# Patient Record
Sex: Female | Born: 2000 | Race: White | Hispanic: No | Marital: Single | Smoking: Never smoker
Health system: Southern US, Community
[De-identification: ages and names within clinical notes are randomized; demographics above are authoritative.]

---
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
## 2019-05-05 ENCOUNTER — Other Ambulatory Visit: Payer: Self-pay

## 2019-05-05 ENCOUNTER — Ambulatory Visit: Payer: Self-pay | Attending: Internal Medicine

## 2019-05-05 DIAGNOSIS — Z23 Encounter for immunization: Secondary | ICD-10-CM

## 2019-05-05 NOTE — Progress Notes (Signed)
   Covid-19 Vaccination Clinic  Name:  Morgan Newman    MRN: [id number] DOB: 09-Dec-2000  05/05/2019  [name] was observed post Covid-19 immunization for 15 minutes without incident. She was provided with Vaccine Information Sheet and instruction to access the V-Safe system.   [name] was instructed to call 911 with any severe reactions post vaccine: Marland Kitchen Difficulty breathing  . Swelling of face and throat  . A fast heartbeat  . A bad rash all over body  . Dizziness and weakness   Immunizations Administered    Name Date Dose VIS Date Route   Pfizer COVID-19 Vaccine 05/05/2019 10:11 AM 0.3 mL 01/27/2019 Intramuscular   Manufacturer: [employer], [employer]   Lot: [id number]   NDC: [address]-7

## 2019-05-31 ENCOUNTER — Ambulatory Visit: Payer: Self-pay | Attending: Internal Medicine

## 2019-05-31 DIAGNOSIS — Z23 Encounter for immunization: Secondary | ICD-10-CM

## 2019-05-31 NOTE — Progress Notes (Signed)
   Covid-19 Vaccination Clinic  Name:  Marium Ragan    MRN: [id number] DOB: 12/03/2000  05/31/2019  Ms. Creeden was observed post Covid-19 immunization for 15 minutes without incident. She was provided with Vaccine Information Sheet and instruction to access the V-Safe system.   Ms. Altamura was instructed to call 911 with any severe reactions post vaccine: Marland Kitchen Difficulty breathing  . Swelling of face and throat  . A fast heartbeat  . A bad rash all over body  . Dizziness and weakness   Immunizations Administered    Name Date Dose VIS Date Route   Pfizer COVID-19 Vaccine 05/31/2019  8:46 AM 0.3 mL 01/27/2019 Intramuscular   Manufacturer: [employer], [employer]   Lot: [id number]   NDC: [address]-1

## 2019-10-11 ENCOUNTER — Encounter: Payer: Self-pay | Admitting: Obstetrics and Gynecology

## 2019-10-25 ENCOUNTER — Other Ambulatory Visit (HOSPITAL_COMMUNITY)
Admission: RE | Admit: 2019-10-25 | Discharge: 2019-10-25 | Disposition: A | Discharge status: Home / Self Care | Payer: BC Managed Care – PPO | Source: Ambulatory Visit | Attending: Obstetrics and Gynecology | Admitting: Obstetrics and Gynecology

## 2019-10-25 ENCOUNTER — Encounter: Payer: Self-pay | Admitting: Obstetrics and Gynecology

## 2019-10-25 ENCOUNTER — Ambulatory Visit: Payer: BC Managed Care – PPO | Admitting: Obstetrics and Gynecology

## 2019-10-25 ENCOUNTER — Other Ambulatory Visit: Payer: Self-pay

## 2019-10-25 VITALS — BP 97/65 | HR 90 | Ht 65.0 in | Wt 131.1 lb

## 2019-10-25 DIAGNOSIS — Z975 Presence of (intrauterine) contraceptive device: Secondary | ICD-10-CM | POA: Diagnosis not present

## 2019-10-25 DIAGNOSIS — Z202 Contact with and (suspected) exposure to infections with a predominantly sexual mode of transmission: Secondary | ICD-10-CM | POA: Diagnosis not present

## 2019-10-25 DIAGNOSIS — Z01419 Encounter for gynecological examination (general) (routine) without abnormal findings: Secondary | ICD-10-CM | POA: Diagnosis not present

## 2019-10-25 DIAGNOSIS — N921 Excessive and frequent menstruation with irregular cycle: Secondary | ICD-10-CM | POA: Diagnosis not present

## 2019-10-25 NOTE — Addendum Note (Signed)
Addended by: Brooke Dare on: 10/25/2019 10:29 AM   Modules accepted: Orders

## 2019-10-25 NOTE — Addendum Note (Signed)
Addended by: Silvano Bilis on: 10/25/2019 10:27 AM   Modules accepted: Orders

## 2019-10-25 NOTE — Addendum Note (Signed)
Addended by: Brooke Dare on: 10/25/2019 10:26 AM   Modules accepted: Orders

## 2019-10-25 NOTE — Progress Notes (Signed)
HPI:      [name] is a 19 y.o. No obstetric history on file. who LMP was No LMP recorded. (Menstrual status: IUD).  Subjective:   She presents today for her annual examination.  Patient has a [place] IUD placed in 2019.  She states that her spotting has become worse over the last few months to the point where she is bleeding intermittently.  She also complains of occasional cramping with the bleeding. Patient would like to be tested for STDs.  She denies symptoms. She reports that she has never had a Pap smear.    Hx: The following portions of the patient's history were reviewed and updated as appropriate:             She  has no past medical history on file. She does not have a problem list on file. She  has no past surgical history on file. Her family history includes Colon cancer in her paternal grandfather. She  reports that she has never smoked. She has never used smokeless tobacco. She reports current alcohol use. She reports that she does not use drugs. She has a current medication list which includes the following prescription(s): cetirizine and kyleena. She has No Known Allergies.       Review of Systems:  Review of Systems  Constitutional: Denied constitutional symptoms, night sweats, recent illness, fatigue, fever, insomnia and weight loss.  Eyes: Denied eye symptoms, eye pain, photophobia, vision change and visual disturbance.  Ears/Nose/Throat/Neck: Denied ear, nose, throat or neck symptoms, hearing loss, nasal discharge, sinus congestion and sore throat.  Cardiovascular: Denied cardiovascular symptoms, arrhythmia, chest pain/pressure, edema, exercise intolerance, orthopnea and palpitations.  Respiratory: Denied pulmonary symptoms, asthma, pleuritic pain, productive sputum, cough, dyspnea and wheezing.  Gastrointestinal: Denied, gastro-esophageal reflux, melena, nausea and vomiting.  Genitourinary: See HPI for additional information.  Musculoskeletal: Denied  musculoskeletal symptoms, stiffness, swelling, muscle weakness and myalgia.  Dermatologic: Denied dermatology symptoms, rash and scar.  Neurologic: Denied neurology symptoms, dizziness, headache, neck pain and syncope.  Psychiatric: Denied psychiatric symptoms, anxiety and depression.  Endocrine: Denied endocrine symptoms including hot flashes and night sweats.   Meds:   Current Outpatient Medications on File Prior to Visit  Medication Sig Dispense Refill  . cetirizine (ZYRTEC) 10 MG tablet Take 10 mg by mouth daily.    Marland Kitchen levonorgestrel (KYLEENA) 19.5 MG IUD by Intrauterine route once.     No current facility-administered medications on file prior to visit.    Objective:     Vitals:   10/25/19 0852  BP: 97/65  Pulse: 90              Physical examination General NAD, Conversant  HEENT Atraumatic; Op clear with mmm.  Normo-cephalic. Pupils reactive. Anicteric sclerae  Thyroid/Neck Smooth without nodularity or enlargement. Normal ROM.  Neck Supple.  Skin No rashes, lesions or ulceration. Normal palpated skin turgor. No nodularity.  Breasts: No masses or discharge.  Symmetric.  No axillary adenopathy.  Lungs: Clear to auscultation.No rales or wheezes. Normal Respiratory effort, no retractions.  Heart: NSR.  No murmurs or rubs appreciated. No periferal edema  Abdomen: Soft.  Non-tender.  No masses.  No HSM. No hernia  Extremities: Moves all appropriately.  Normal ROM for age. No lymphadenopathy.  Neuro: Oriented to PPT.  Normal mood. Normal affect.     Pelvic:   Vulva: Normal appearance.  No lesions.  Vagina: No lesions or abnormalities noted.  Support: Normal pelvic support.  Urethra No masses tenderness  or scarring.  Meatus Normal size without lesions or prolapse.  Cervix: Normal appearance.  No lesions.  IUD strings noted at the cervical os  Anus: Normal exam.  No lesions.  Perineum: Normal exam.  No lesions.        Bimanual   Uterus: Normal size.  Non-tender.  Mobile.   AV.  Adnexae: No masses.  Non-tender to palpation.  Cul-de-sac: Negative for abnormality.      Assessment:    No obstetric history on file. There are no problems to display for this patient.    1. Well woman exam with routine gynecological exam   2. Possible exposure to STD   3. Breakthrough bleeding associated with intrauterine device (IUD)     Normal exam  Patient having some bleeding with IUD-possible malposition.   Plan:            1.  Basic Screening Recommendations The basic screening recommendations for asymptomatic women were discussed with the patient during her visit.  The age-appropriate recommendations were discussed with her and the rational for the tests reviewed.  When I am informed by the patient that another primary care physician has previously obtained the age-appropriate tests and they are up-to-date, only outstanding tests are ordered and referrals given as necessary.  Abnormal results of tests will be discussed with her when all of her results are completed.  Routine preventative health maintenance measures emphasized: Exercise/Diet/Weight control, Tobacco Warnings, Alcohol/Substance use risks and Stress Management Nuswab performed at patient request. Pap done 2.  Ultrasound for IUD position ordered. Orders Orders Placed This Encounter  Procedures  . US PELVIS TRANSVAGINAL NON-OB (TV ONLY)  . US PELVIS (TRANSABDOMINAL ONLY)    No orders of the defined types were placed in this encounter.         F/U  No follow-ups on file.  Elonda Husky, M.D. 10/25/2019 9:54 AM

## 2019-10-26 ENCOUNTER — Other Ambulatory Visit (HOSPITAL_COMMUNITY)
Admission: RE | Admit: 2019-10-26 | Discharge: 2019-10-26 | Disposition: A | Discharge status: Home / Self Care | Payer: BC Managed Care – PPO | Source: Ambulatory Visit | Attending: Obstetrics and Gynecology | Admitting: Obstetrics and Gynecology

## 2019-10-26 DIAGNOSIS — N921 Excessive and frequent menstruation with irregular cycle: Secondary | ICD-10-CM | POA: Diagnosis present

## 2019-10-26 DIAGNOSIS — Z975 Presence of (intrauterine) contraceptive device: Secondary | ICD-10-CM | POA: Diagnosis present

## 2019-10-26 DIAGNOSIS — Z202 Contact with and (suspected) exposure to infections with a predominantly sexual mode of transmission: Secondary | ICD-10-CM | POA: Diagnosis not present

## 2019-10-26 NOTE — Addendum Note (Signed)
Addended by: Yevonne Aline L on: 10/26/2019 09:00 AM   Modules accepted: Orders

## 2019-10-27 LAB — CERVICOVAGINAL ANCILLARY ONLY
Bacterial Vaginitis (gardnerella): NEGATIVE
Candida Glabrata: NEGATIVE
Candida Vaginitis: POSITIVE — AB
Chlamydia: NEGATIVE
Comment: NEGATIVE
Comment: NEGATIVE
Comment: NEGATIVE
Comment: NEGATIVE
Comment: NEGATIVE
Comment: NORMAL
Neisseria Gonorrhea: NEGATIVE
Trichomonas: NEGATIVE

## 2019-10-27 LAB — CYTOLOGY - PAP
Diagnosis: NEGATIVE
Diagnosis: REACTIVE

## 2019-11-01 ENCOUNTER — Other Ambulatory Visit: Payer: Self-pay

## 2019-11-01 ENCOUNTER — Ambulatory Visit (INDEPENDENT_AMBULATORY_CARE_PROVIDER_SITE_OTHER): Payer: BC Managed Care – PPO

## 2019-11-01 DIAGNOSIS — Z975 Presence of (intrauterine) contraceptive device: Secondary | ICD-10-CM

## 2019-11-01 DIAGNOSIS — N921 Excessive and frequent menstruation with irregular cycle: Secondary | ICD-10-CM | POA: Diagnosis not present

## 2019-11-07 ENCOUNTER — Telehealth: Payer: Self-pay

## 2019-11-07 MED ORDER — FLUCONAZOLE 150 MG PO TABS: 150.0000 mg | ORAL_TABLET | Freq: Once | ORAL | 0 refills | Status: AC

## 2019-11-07 NOTE — Telephone Encounter (Signed)
mychart message sent to patient- positive yeast culture. Diflucan sent to preferred pharmacy per Dr Logan Bores.

## 2019-11-07 NOTE — Telephone Encounter (Signed)
-----   Message from Linzie Collin, MD sent at 11/04/2019  9:58 AM EDT ----- Test is positive for yeast vaginitis.  Please call in a prescription for Diflucan 150 mg 1 pill.  thank you

## 2019-11-19 IMAGING — CR DG FOOT COMPLETE 3+V*R*
1 series · 3 of 3 positions shown · non-contrast
Comparison: No prior.

CLINICAL DATA: Foot pain.

EXAM:
RIGHT FOOT COMPLETE - 3+ VIEW

[Series 1: dg foot complete right · 0.14mm/px · 3 of 3 slices shown]
[im 1/3]
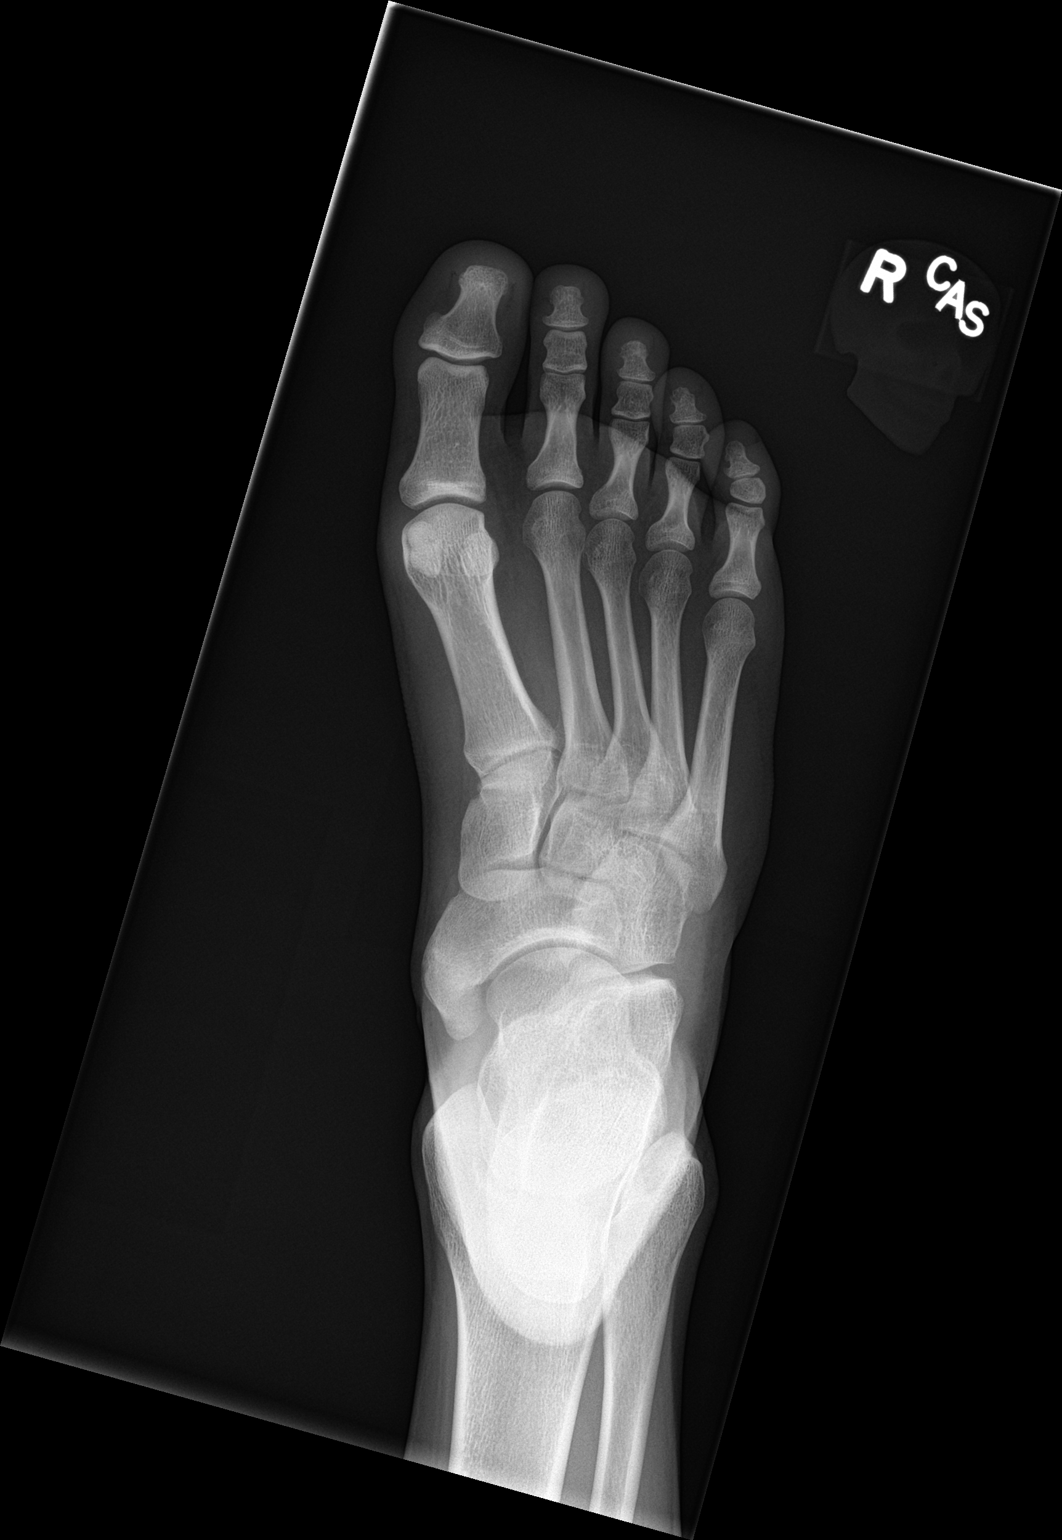
[im 2/3]
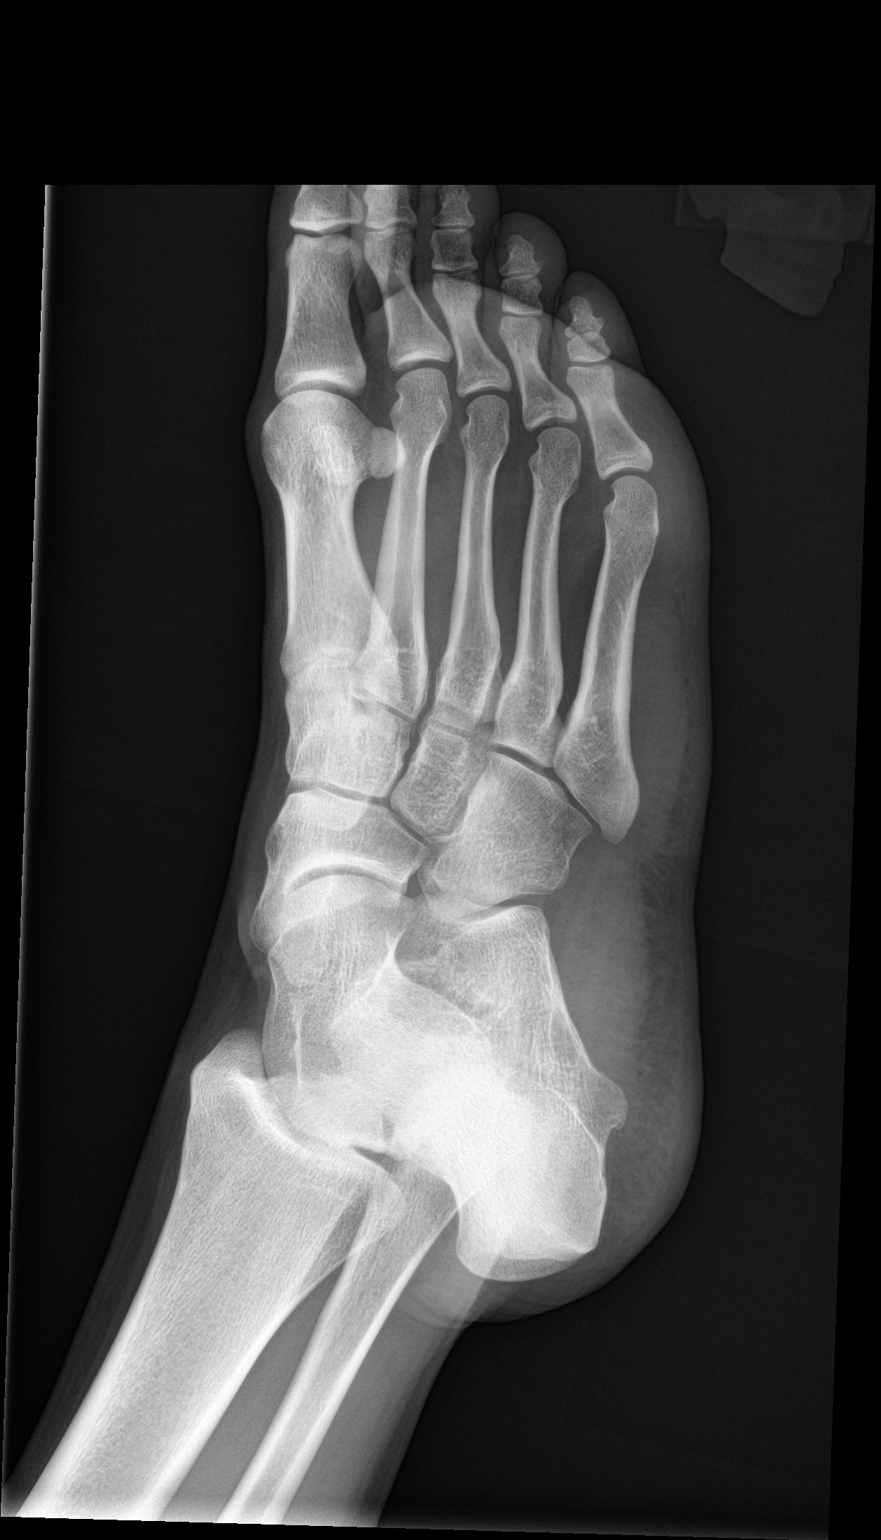
[im 3/3]
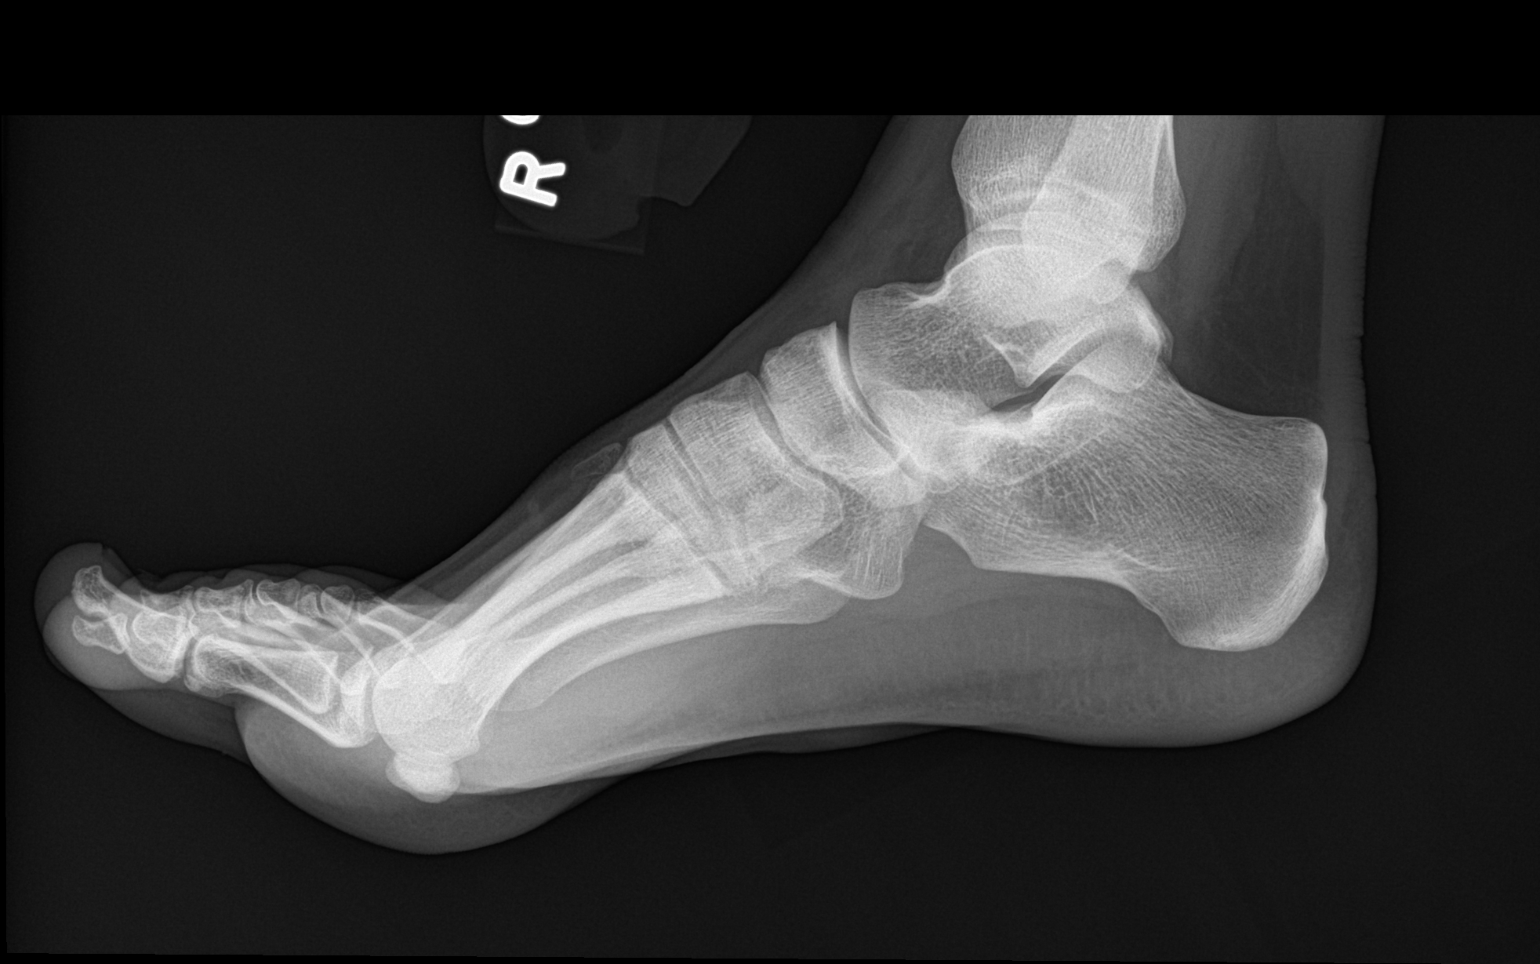

[3 of 3 positions shown; findings below may reference images not displayed]

FINDINGS: No acute bony or joint abnormality identified. No evidence of
fracture or dislocation.
IMPRESSION: No acute abnormality.
